# Patient Record
Sex: Female | Born: 1965 | Race: Black or African American | Hispanic: No | Marital: Single | Smoking: Never smoker
Health system: Southern US, Community
[De-identification: ages and names within clinical notes are randomized; demographics above are authoritative.]

---

## 2014-12-02 ENCOUNTER — Telehealth: Payer: Self-pay

## 2014-12-02 NOTE — Telephone Encounter (Signed)
Erroneous

## 2022-04-14 ENCOUNTER — Emergency Department (HOSPITAL_COMMUNITY)
Admission: EM | Admit: 2022-04-14 | Discharge: 2022-04-14 | Disposition: A | Discharge status: Home / Self Care | Payer: Medicaid Other | Attending: Emergency Medicine | Admitting: Emergency Medicine

## 2022-04-14 ENCOUNTER — Emergency Department (HOSPITAL_COMMUNITY): Payer: Medicaid Other

## 2022-04-14 ENCOUNTER — Other Ambulatory Visit: Payer: Self-pay

## 2022-04-14 ENCOUNTER — Encounter (HOSPITAL_COMMUNITY): Payer: Self-pay

## 2022-04-14 ENCOUNTER — Telehealth (HOSPITAL_COMMUNITY): Payer: Self-pay | Admitting: Emergency Medicine

## 2022-04-14 DIAGNOSIS — M25551 Pain in right hip: Secondary | ICD-10-CM | POA: Insufficient documentation

## 2022-04-14 MED ORDER — OXYCODONE-ACETAMINOPHEN 5-325 MG PO TABS: 1.0000 | ORAL_TABLET | Freq: Four times a day (QID) | ORAL | 0 refills | Status: AC | PRN

## 2022-04-14 MED ORDER — HYDROMORPHONE HCL 1 MG/ML IJ SOLN
1.0000 mg | Freq: Once | INTRAMUSCULAR | Status: AC
  Administered 2022-04-14: 1 mg via INTRAMUSCULAR
  Filled 2022-04-14: qty 1

## 2022-04-14 MED ORDER — ONDANSETRON 4 MG PO TBDP
4.0000 mg | ORAL_TABLET | Freq: Once | ORAL | Status: AC
  Administered 2022-04-14: 4 mg via ORAL
  Filled 2022-04-14: qty 1

## 2022-04-14 MED ORDER — PREDNISONE 20 MG PO TABS: 20.0000 mg | ORAL_TABLET | Freq: Every day | ORAL | 0 refills | Status: AC

## 2022-04-14 MED ORDER — PREDNISONE 10 MG PO TABS: 20.0000 mg | ORAL_TABLET | Freq: Every day | ORAL | 0 refills | Status: AC

## 2022-04-14 NOTE — ED Triage Notes (Signed)
Pt here POV c/o sciatic nerve pain that travels all the way down to her leg. Pt said it usually goes away on its own, but this time it has been a month , and its worsening. Pt appears tearful. A&O X4 .

## 2022-04-14 NOTE — ED Provider Notes (Signed)
[facility] EMERGENCY DEPARTMENT Provider Note   CSN: [place]:[id number] Arrival date & time: 04/14/22  [id number]     History {Add pertinent medical, surgical, social history, OB history to HPI:1} No chief complaint on file.   Monique Rios is a 56 y.o. female.  Patient complains of back and right hip pain.   Hip Pain      Home Medications Prior to Admission medications   Medication Sig Start Date End Date Taking? Authorizing Provider  oxyCODONE-acetaminophen (PERCOCET/ROXICET) 5-325 MG tablet Take 1 tablet by mouth every 6 (six) hours as needed for severe pain. 04/14/22  Yes Milton Ferguson, MD  predniSONE (DELTASONE) 10 MG tablet Take 2 tablets (20 mg total) by mouth daily. 04/14/22  Yes Milton Ferguson, MD      Allergies    Patient has no allergy information on record.    Review of Systems   Review of Systems  Physical Exam Updated Vital Signs BP 121/88   Pulse 73   Temp 98.3 F (36.8 C) (Oral)   Resp 19   Ht 5\' 5"  (1.651 m)   Wt 81.6 kg   SpO2 97%   BMI 29.95 kg/m  Physical Exam  ED Results / Procedures / Treatments   Labs (all labs ordered are listed, but only abnormal results are displayed) Labs Reviewed - No data to display  EKG None  Radiology DG Lumbar Spine Complete  Result Date: 04/14/2022 CLINICAL DATA:  Right lower back pain and right leg pain for 1 month. EXAM: LUMBAR SPINE - COMPLETE 4+ VIEW COMPARISON:  None Available. FINDINGS: There are 5 non-rib-bearing lumbar-type vertebral bodies. Minimal levocurvature centered at L4-5 with mild right L4-5 disc space narrowing on frontal view. 5 mm grade 1 anterolisthesis of L4 on L5. Vertebral body heights are maintained. High-grade right L4-5 facet joint arthropathy. IMPRESSION: Grade 1 anterolisthesis of L4 on L5. Mild right L4-5 disc space narrowing with high-grade right L4-5 facet joint arthropathy. Electronically Signed   By: Yvonne Kendall M.D.   On: 04/14/2022 10:13   DG Hip Unilat W or  Wo Pelvis 2-3 Views Right  Result Date: 04/14/2022 CLINICAL DATA:  Back pain. Right lower back chronic hip and leg pain. EXAM: DG HIP (WITH OR WITHOUT PELVIS) 2-3V RIGHT COMPARISON:  None Available. FINDINGS: The bilateral sacroiliac and bilateral femoroacetabular joint spaces are maintained. Mild superolateral right right-greater-than-left acetabular degenerative osteophytosis. Mild superior pubic symphysis joint space narrowing and peripheral osteophytosis. 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 Electronically Signed   By: Yvonne Kendall M.D.   On: 04/14/2022 10:10    Procedures Procedures  {Document cardiac monitor, telemetry assessment procedure when appropriate:1}  Medications Ordered in ED Medications  HYDROmorphone (DILAUDID) injection 1 mg (1 mg Intramuscular Given 04/14/22 0946)  ondansetron (ZOFRAN-ODT) disintegrating tablet 4 mg (4 mg Oral Given 04/14/22 1143)    ED Course/ Medical Decision Making/ A&P                           Medical Decision Making Amount and/or Complexity of Data Reviewed Radiology: ordered.  Risk Prescription drug management.   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  She is given prednisone and Percocet and will follow-up with Ortho  {Document critical care time when appropriate:1} {Document review of labs and clinical decision tools ie heart score, Chads2Vasc2 etc:1}  {Document your independent review of radiology images, and any outside records:1} {Document  your discussion with family members, caretakers, and with consultants:1} {Document social determinants of health affecting pt's care:1} {Document your decision making why or why not admission, treatments were needed:1} Final Clinical Impression(s) / ED Diagnoses Final diagnoses:  Right hip pain    Rx / DC Orders ED Discharge Orders          Ordered    predniSONE (DELTASONE) 10 MG tablet  Daily         04/14/22 1143    oxyCODONE-acetaminophen (PERCOCET/ROXICET) 5-325 MG tablet  Every 6 hours PRN        04/14/22 1143

## 2022-04-14 NOTE — Discharge Instructions (Signed)
Follow up with Dr. Zachery Dakins of one of his partners next week.  Call this week for an appointment and tell them you were referred from the emergency department

## 2022-04-14 NOTE — Telephone Encounter (Signed)
Pt needed rx sent to pharmacy.

## 2023-06-03 IMAGING — CR DG LUMBAR SPINE COMPLETE 4+V
5 series · 5 of 5 positions shown · non-contrast
Comparison: None Available.

CLINICAL DATA: Right lower back pain and right leg pain for 1
month.

EXAM:
LUMBAR SPINE - COMPLETE 4+ VIEW

[l-spine ap]
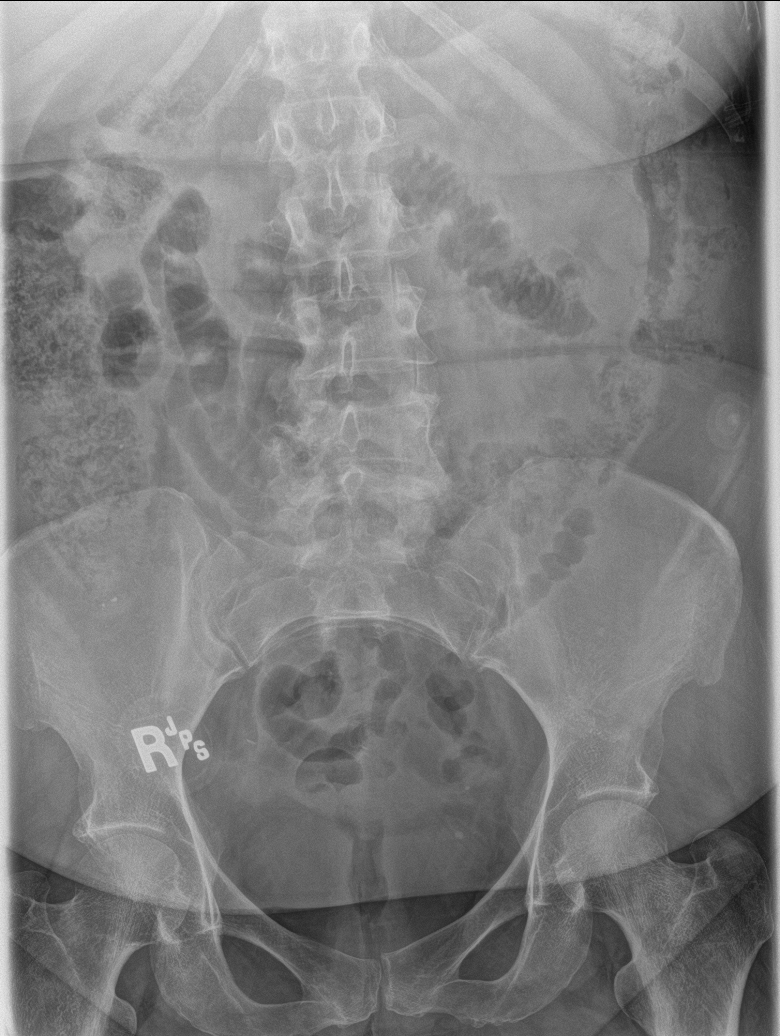

[l-spine obl (1 of 2)]
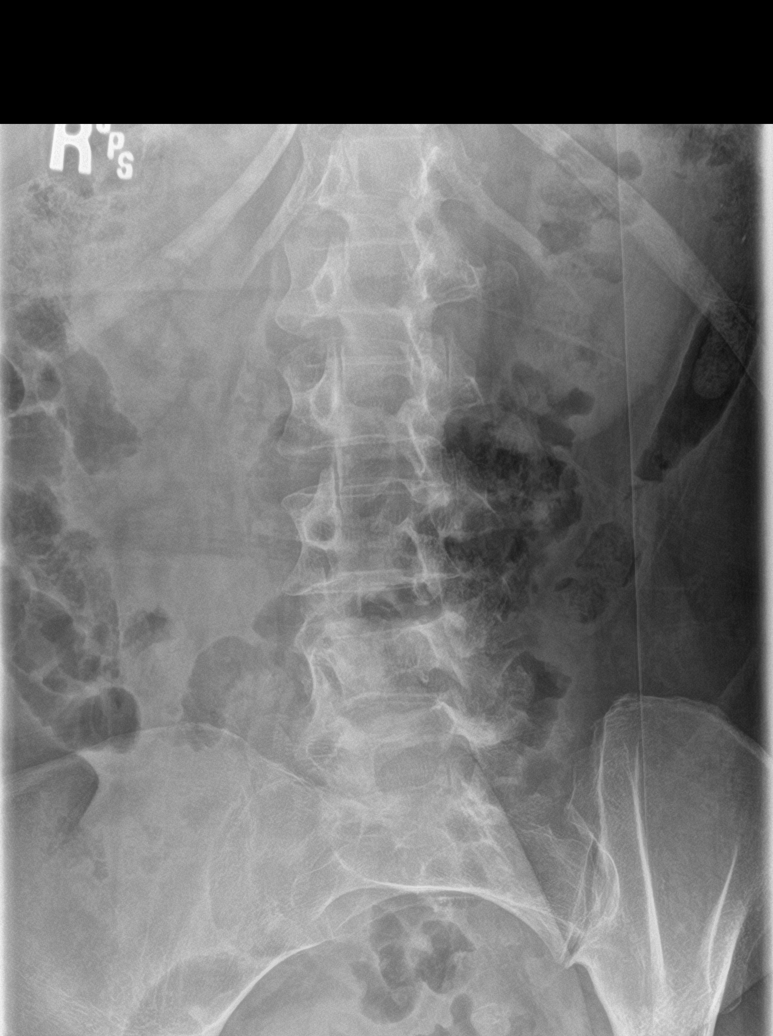

[l-spine obl (2 of 2)]
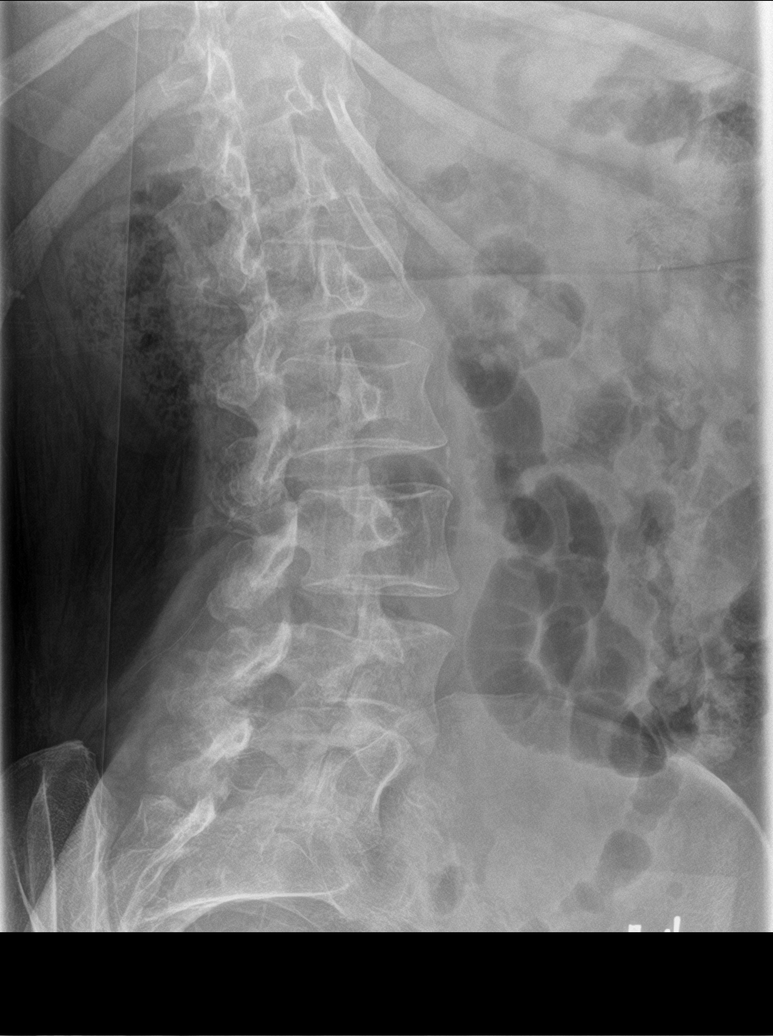

[l-spine lat]
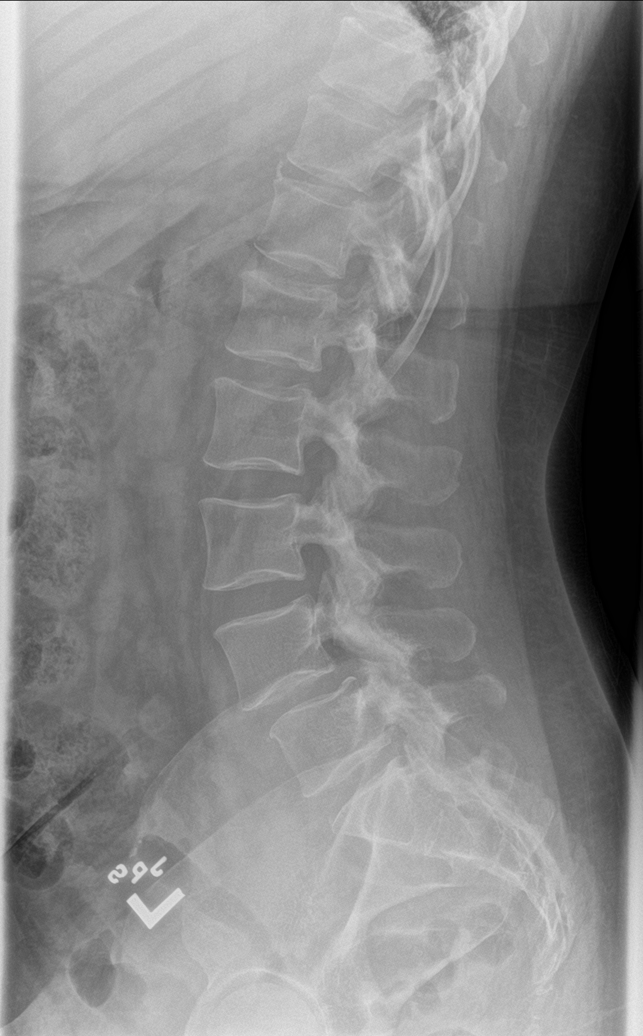

[l-spine spot]
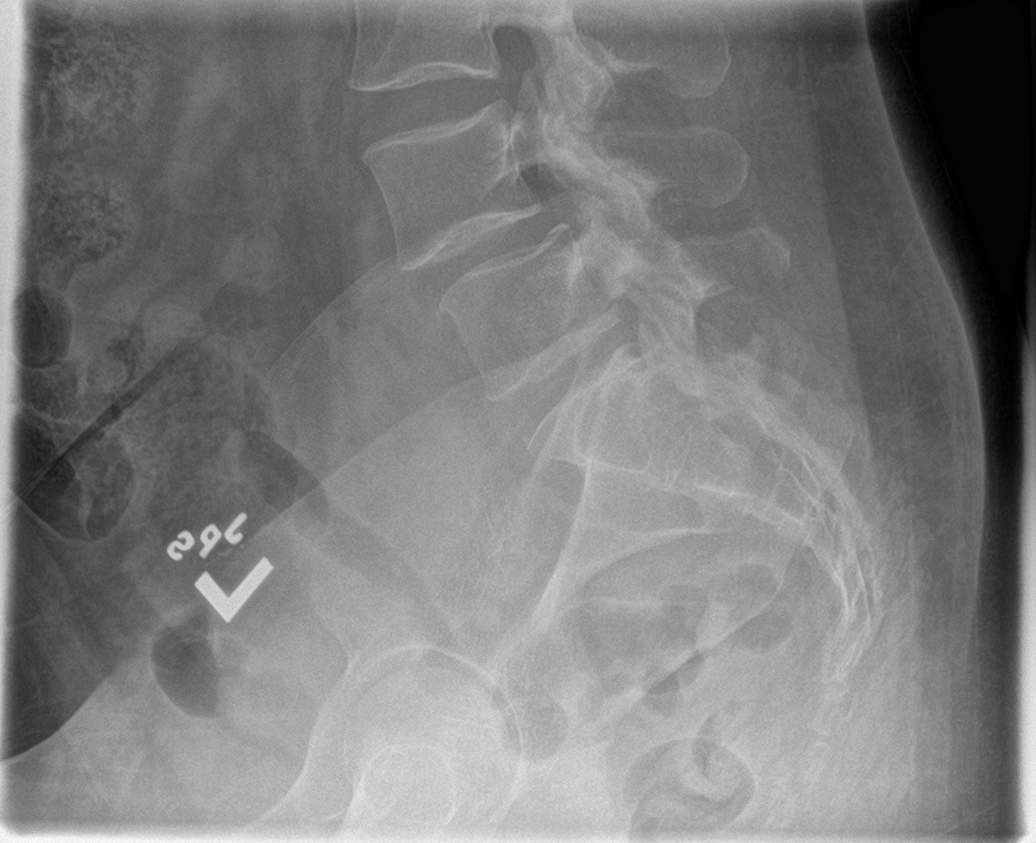

[5 of 5 positions shown; findings below may reference images not displayed]

FINDINGS: There are 5 non-rib-bearing lumbar-type vertebral bodies. Minimal
levocurvature centered at L4-5 with mild right L4-5 disc space
narrowing on frontal view. 5 mm grade 1 anterolisthesis of L4 on L5.

Vertebral body heights are maintained. High-grade right L4-5 facet
joint arthropathy.
IMPRESSION: Grade 1 anterolisthesis of L4 on L5. Mild right L4-5 disc space
narrowing with high-grade right L4-5 facet joint arthropathy.

## 2023-06-03 IMAGING — CR DG HIP (WITH OR WITHOUT PELVIS) 2-3V*R*
3 series · 3 of 3 positions shown · non-contrast
Comparison: None Available.

CLINICAL DATA: Back pain. Right lower back chronic hip and leg
pain.

EXAM:
DG HIP (WITH OR WITHOUT PELVIS) 2-3V RIGHT

[pelvis ap]
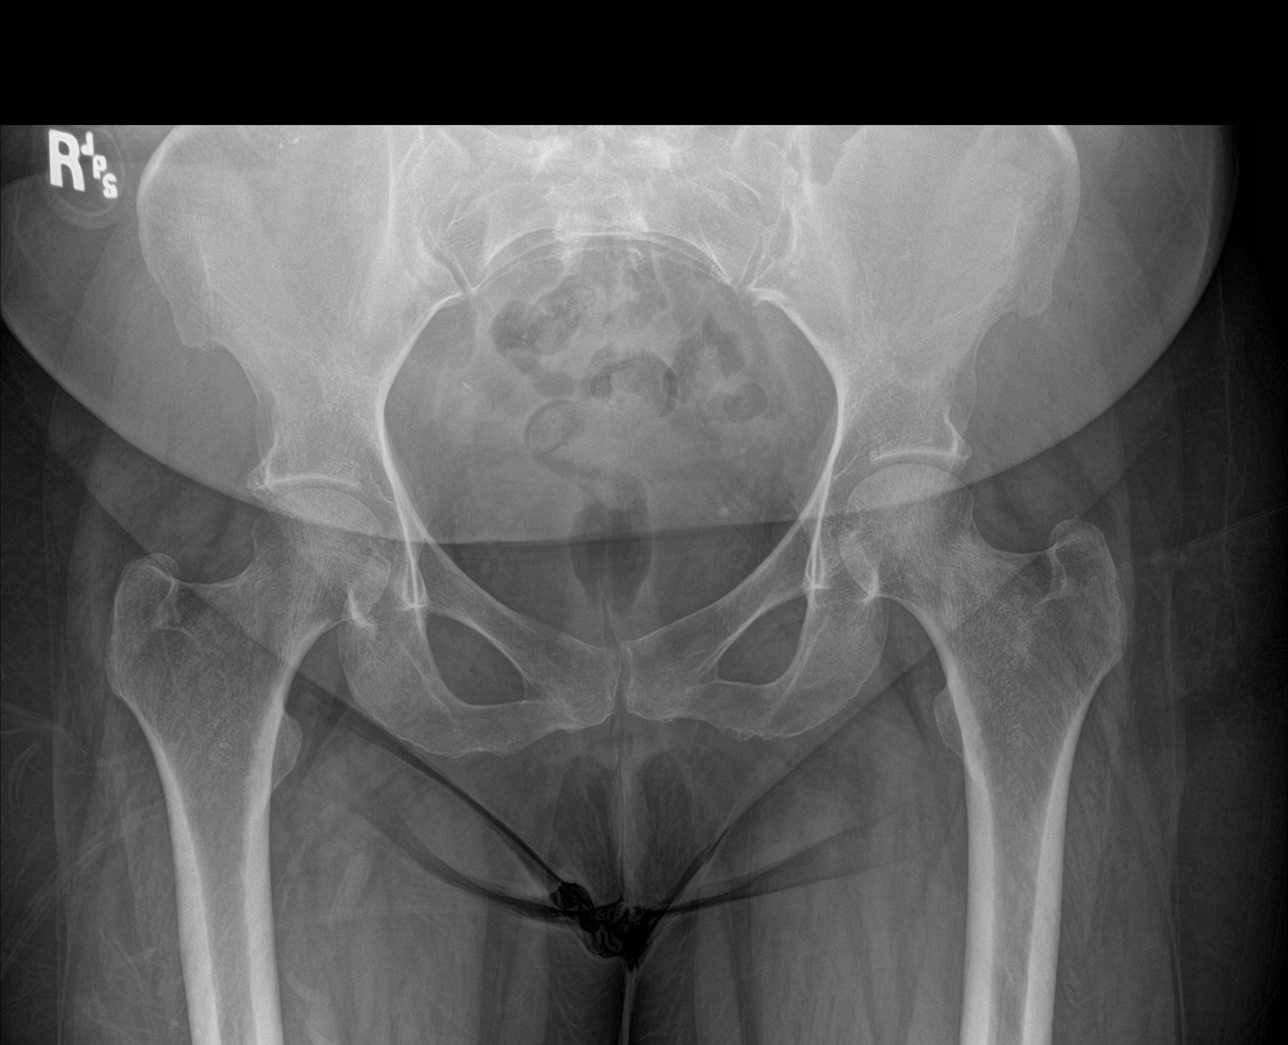

[hip ap]
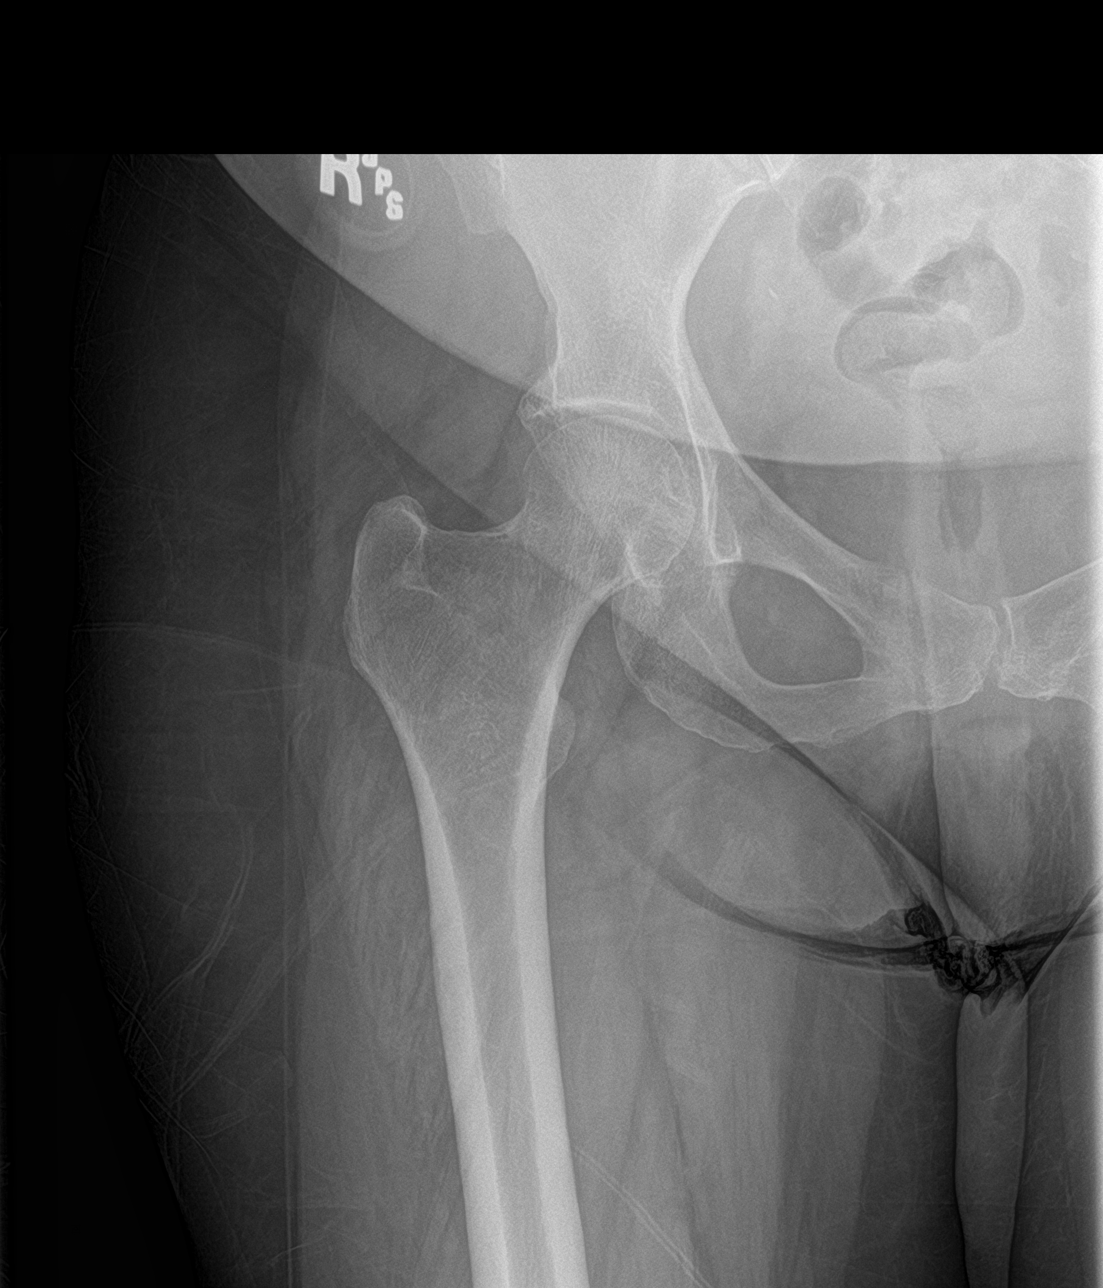

[hip lat]
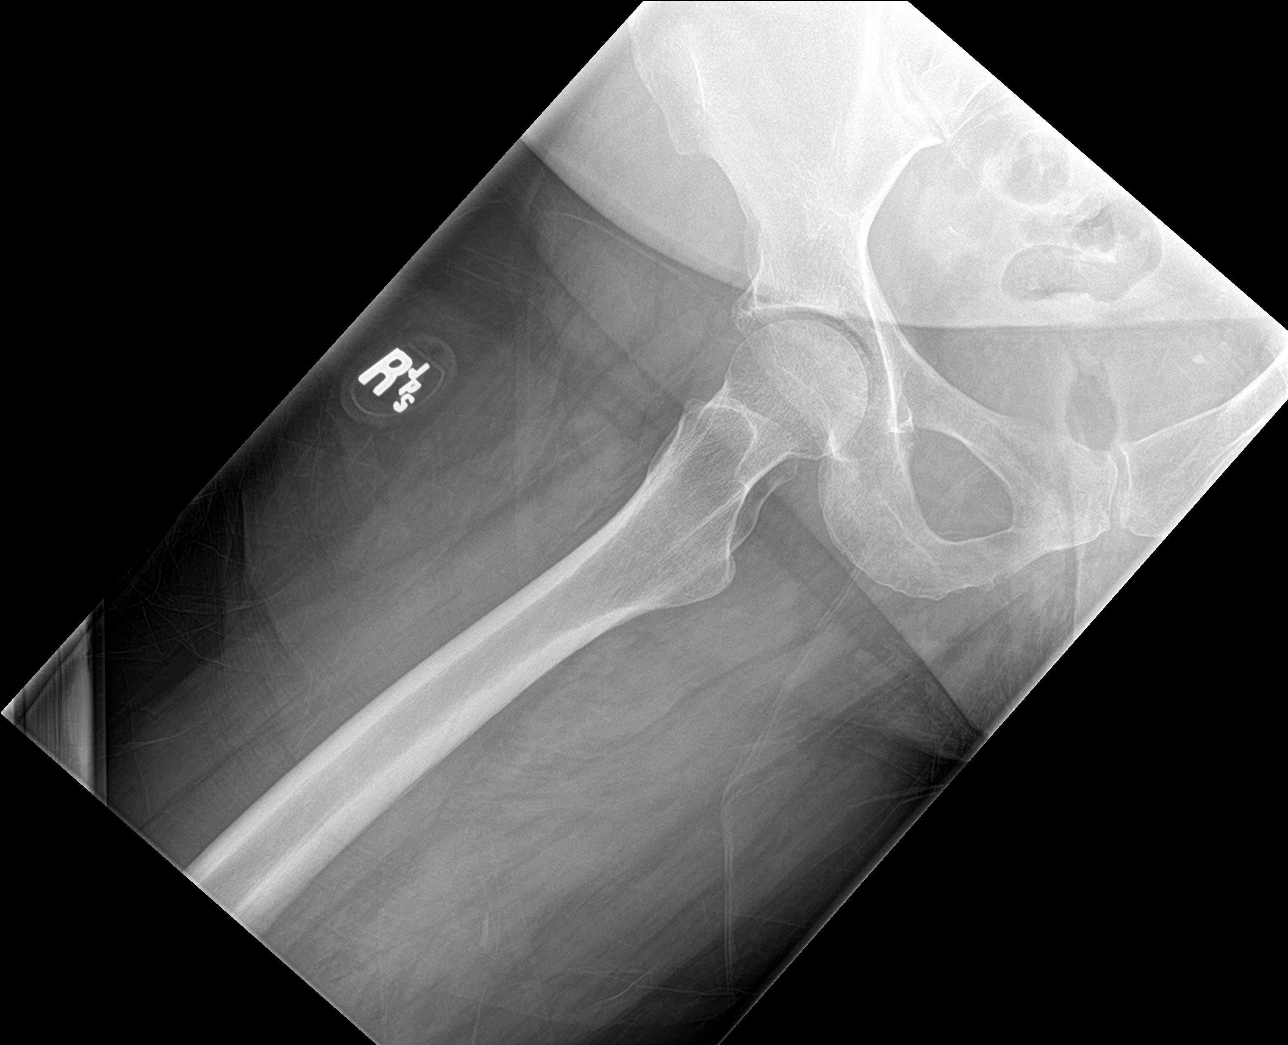

[3 of 3 positions shown; findings below may reference images not displayed]

FINDINGS: The bilateral sacroiliac and bilateral femoroacetabular joint spaces
are maintained. Mild superolateral right right-greater-than-left
acetabular degenerative osteophytosis. Mild superior pubic symphysis
joint space narrowing and peripheral osteophytosis.
IMPRESSION: Very mild right femoroacetabular and pubic symphysis osteoarthritis.
